# Patient Record
Sex: Male | Born: 2000
Health system: Southern US, Community
[De-identification: ages and names within clinical notes are randomized; demographics above are authoritative.]

## PROBLEM LIST (undated history)

## (undated) DIAGNOSIS — B019 Varicella without complication: Secondary | ICD-10-CM

## (undated) DIAGNOSIS — T7840XA Allergy, unspecified, initial encounter: Secondary | ICD-10-CM

## (undated) HISTORY — DX: Allergy, unspecified, initial encounter: T78.40XA

## (undated) HISTORY — DX: Varicella without complication: B01.9

---
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
## 2019-01-02 ENCOUNTER — Other Ambulatory Visit: Payer: Self-pay

## 2019-01-02 ENCOUNTER — Other Ambulatory Visit: Payer: 59 | Admitting: *Deleted

## 2019-01-02 ENCOUNTER — Ambulatory Visit (HOSPITAL_COMMUNITY): Payer: 59 | Attending: Cardiovascular Disease

## 2019-01-02 DIAGNOSIS — U071 COVID-19: Secondary | ICD-10-CM | POA: Insufficient documentation

## 2019-01-02 DIAGNOSIS — Z03818 Encounter for observation for suspected exposure to other biological agents ruled out: Secondary | ICD-10-CM | POA: Diagnosis not present

## 2019-01-02 LAB — TROPONIN I (HIGH SENSITIVITY): Troponin I (High Sensitivity): 4 ng/L (ref <18)

## 2019-01-03 ENCOUNTER — Other Ambulatory Visit (HOSPITAL_COMMUNITY): Payer: 59

## 2019-10-15 ENCOUNTER — Ambulatory Visit: Payer: 59 | Attending: Internal Medicine

## 2019-10-15 ENCOUNTER — Ambulatory Visit: Payer: 59

## 2019-10-15 DIAGNOSIS — Z23 Encounter for immunization: Secondary | ICD-10-CM

## 2019-10-15 DIAGNOSIS — H5213 Myopia, bilateral: Secondary | ICD-10-CM | POA: Diagnosis not present

## 2019-10-15 NOTE — Progress Notes (Signed)
   Covid-19 Vaccination Clinic  Name:  Jeffrey Lozano    MRN: [id number] DOB: [birth year]  10/15/2019  Mr. Jeffrey Lozano was observed post Covid-19 immunization for 15 minutes without incident. He was provided with Vaccine Information Sheet and instruction to access the V-Safe system.   Mr. Jeffrey Lozano was instructed to call 911 with any severe reactions post vaccine: Marland Kitchen Difficulty breathing  . Swelling of face and throat  . A fast heartbeat  . A bad rash all over body  . Dizziness and weakness   Immunizations Administered    Name Date Dose VIS Date Route   Pfizer COVID-19 Vaccine 10/15/2019  2:28 PM 0.3 mL 05/01/2018 Intramuscular   Manufacturer: [employer], [employer]   Lot: [id number]   NDC: [address]-9

## 2019-11-05 ENCOUNTER — Ambulatory Visit: Payer: 59 | Attending: Internal Medicine

## 2019-11-05 DIAGNOSIS — Z23 Encounter for immunization: Secondary | ICD-10-CM

## 2019-11-05 NOTE — Progress Notes (Signed)
   Covid-19 Vaccination Clinic  Name:  Jeffrey Lozano    MRN: [id number] DOB: [birth year]  11/05/2019  Jeffrey Lozano was observed post Covid-19 immunization for 15 minutes without incident. He was provided with Vaccine Information Sheet and instruction to access the V-Safe system.   Jeffrey Lozano was instructed to call 911 with any severe reactions post vaccine: Marland Kitchen Difficulty breathing  . Swelling of face and throat  . A fast heartbeat  . A bad rash all over body  . Dizziness and weakness   Immunizations Administered    Name Date Dose VIS Date Route   Pfizer COVID-19 Vaccine 11/05/2019 11:17 AM 0.3 mL 05/01/2018 Intramuscular   Manufacturer: [employer], [employer]   Lot: [id number]   NDC: [address]-3

## 2019-11-06 DIAGNOSIS — Z1159 Encounter for screening for other viral diseases: Secondary | ICD-10-CM | POA: Diagnosis not present

## 2019-11-06 DIAGNOSIS — Z20828 Contact with and (suspected) exposure to other viral communicable diseases: Secondary | ICD-10-CM | POA: Diagnosis not present

## 2019-11-27 DIAGNOSIS — R0609 Other forms of dyspnea: Secondary | ICD-10-CM | POA: Diagnosis not present

## 2019-12-17 DIAGNOSIS — J309 Allergic rhinitis, unspecified: Secondary | ICD-10-CM | POA: Diagnosis not present

## 2019-12-17 DIAGNOSIS — Z23 Encounter for immunization: Secondary | ICD-10-CM | POA: Diagnosis not present

## 2019-12-17 DIAGNOSIS — Z0000 Encounter for general adult medical examination without abnormal findings: Secondary | ICD-10-CM | POA: Diagnosis not present

## 2019-12-17 DIAGNOSIS — R319 Hematuria, unspecified: Secondary | ICD-10-CM | POA: Diagnosis not present

## 2019-12-17 DIAGNOSIS — Z87442 Personal history of urinary calculi: Secondary | ICD-10-CM | POA: Diagnosis not present

## 2019-12-17 DIAGNOSIS — J45990 Exercise induced bronchospasm: Secondary | ICD-10-CM | POA: Diagnosis not present

## 2019-12-19 ENCOUNTER — Other Ambulatory Visit (HOSPITAL_COMMUNITY): Payer: Self-pay | Admitting: *Deleted

## 2019-12-19 DIAGNOSIS — R0600 Dyspnea, unspecified: Secondary | ICD-10-CM

## 2019-12-24 ENCOUNTER — Encounter (HOSPITAL_COMMUNITY): Payer: 59

## 2020-01-07 ENCOUNTER — Other Ambulatory Visit (HOSPITAL_COMMUNITY): Payer: Self-pay | Admitting: *Deleted

## 2020-01-07 ENCOUNTER — Ambulatory Visit (HOSPITAL_COMMUNITY): Payer: 59 | Attending: Family Medicine

## 2020-01-07 ENCOUNTER — Other Ambulatory Visit: Payer: Self-pay

## 2020-01-07 DIAGNOSIS — R0600 Dyspnea, unspecified: Secondary | ICD-10-CM | POA: Insufficient documentation

## 2020-04-09 ENCOUNTER — Other Ambulatory Visit: Payer: Self-pay

## 2020-04-09 ENCOUNTER — Ambulatory Visit: Payer: 59 | Attending: Internal Medicine

## 2020-04-09 DIAGNOSIS — Z23 Encounter for immunization: Secondary | ICD-10-CM

## 2020-04-09 NOTE — Progress Notes (Signed)
   Covid-19 Vaccination Clinic  Name:  Jeffrey Lozano    MRN: [id number] DOB: 2000-08-17  04/09/2020  Jeffrey Lozano was observed post Covid-19 immunization for 15 minutes without incident. He was provided with Vaccine Information Sheet and instruction to access the V-Safe system.   Jeffrey Lozano was instructed to call 911 with any severe reactions post vaccine: Marland Kitchen Difficulty breathing  . Swelling of face and throat  . A fast heartbeat  . A bad rash all over body  . Dizziness and weakness   Immunizations Administered    Name Date Dose VIS Date Route   PFIZER Comrnaty(Gray TOP) Covid-19 Vaccine 04/09/2020  1:13 PM 0.3 mL 02/13/2020 Intramuscular   Manufacturer: [employer], [employer]   Lot: [id number]   NDC: [phone]

## 2020-04-10 ENCOUNTER — Ambulatory Visit: Payer: 59

## 2020-08-03 IMAGING — CT CT ABDOMEN AND PELVIS WITH CONTRAST
2 of 4 series · 15 of 46 positions shown, 17 images · IV contrast (APPLIED)
Comparison: None.

CLINICAL DATA: Right-sided abdominal pain and emesis beginning
yesterday. Abdominal pain, acute, generalized.

EXAM:
CT ABDOMEN AND PELVIS WITH CONTRAST
TECHNIQUE: Multidetector CT imaging of the abdomen and pelvis was performed
using the standard protocol following bolus administration of
intravenous contrast.
CONTRAST:  100mL OMNIPAQUE IOHEXOL 300 MG/ML  SOLN

[Series 3: abdomen 5.0 · axial · 0.75mm/px · z∈[+797,+1222]mm · 12 of 99 slices shown, 14 images]
[im 7/99  soft-tissue]
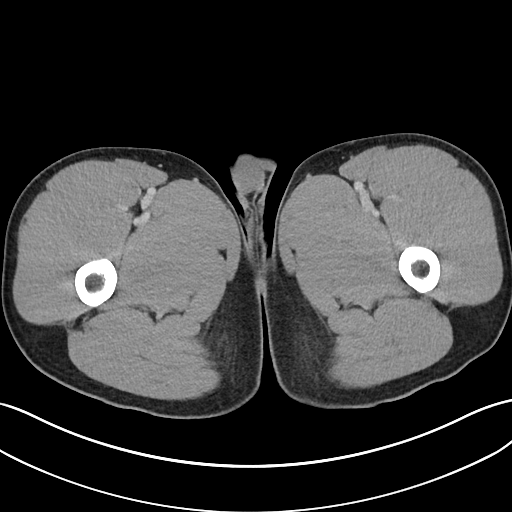
[im 7/99  bone]
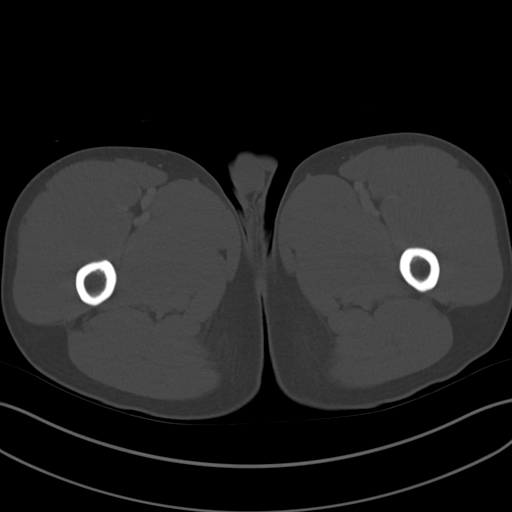
[im 13/99  soft-tissue]
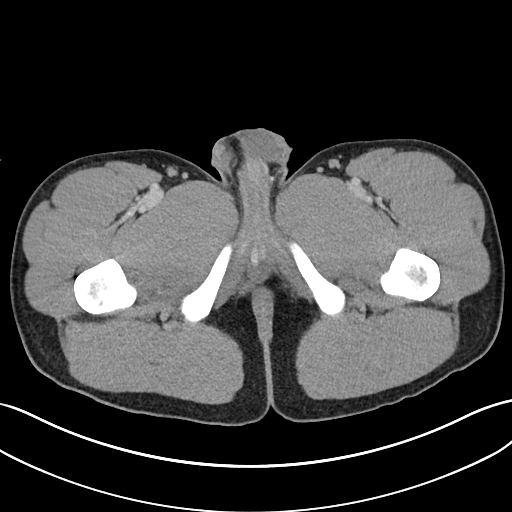
[im 25/99  soft-tissue]
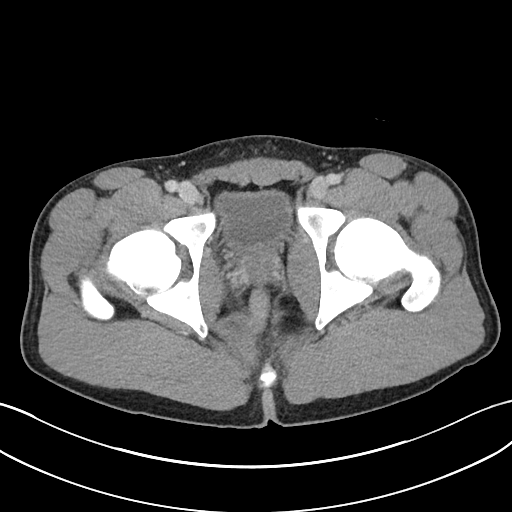
[im 31/99  soft-tissue]
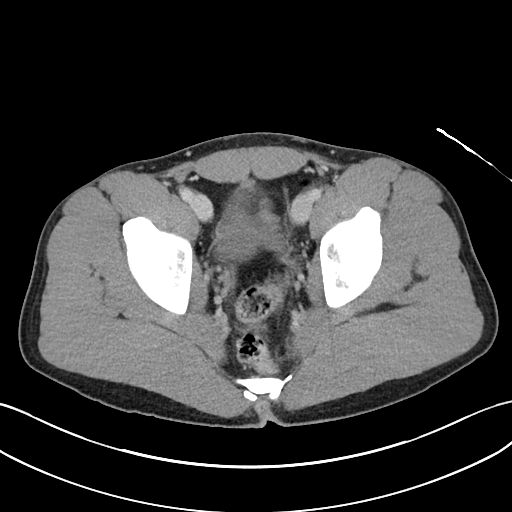
[im 37/99  soft-tissue]
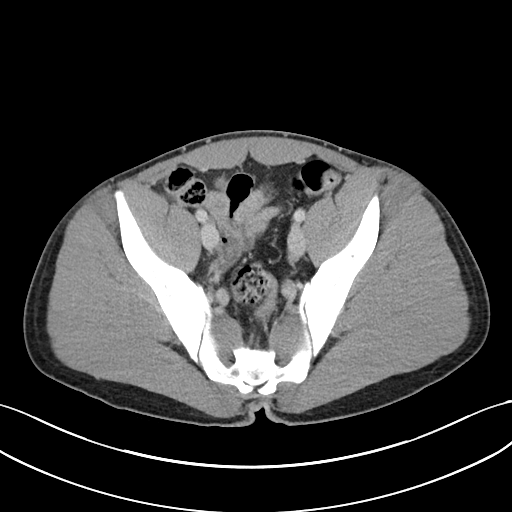
[im 43/99  soft-tissue]
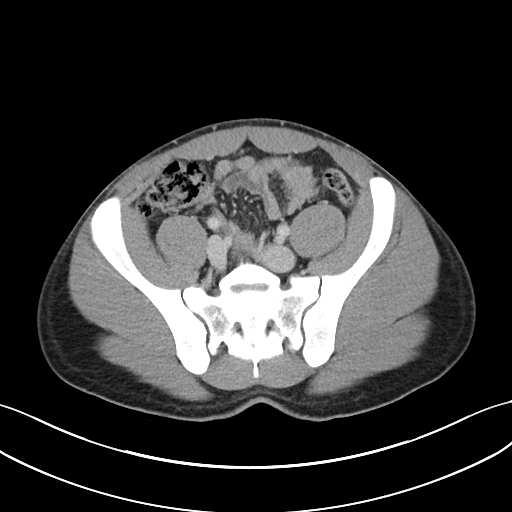
[im 56/99  soft-tissue]
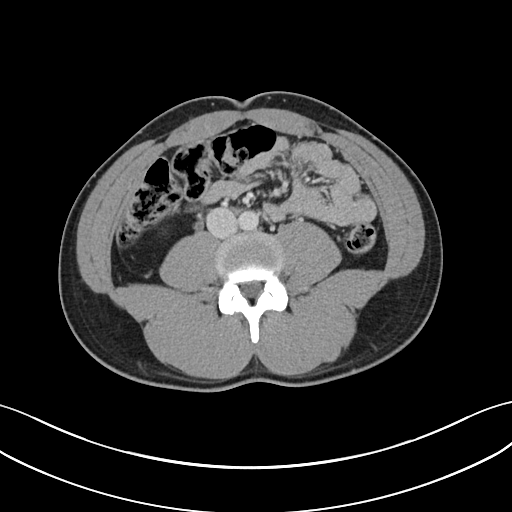
[im 62/99  soft-tissue]
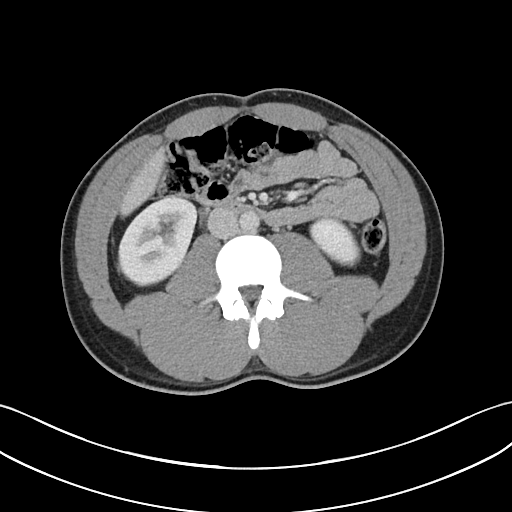
[im 68/99  soft-tissue]
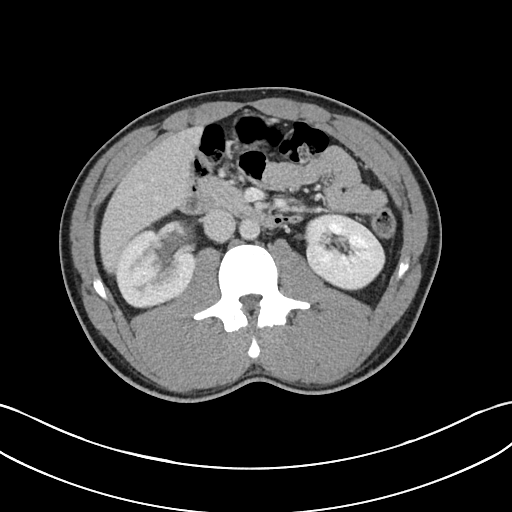
[im 68/99  bone]
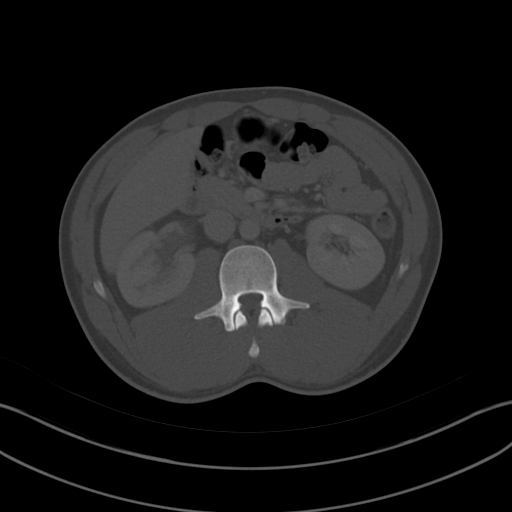
[im 74/99  soft-tissue]
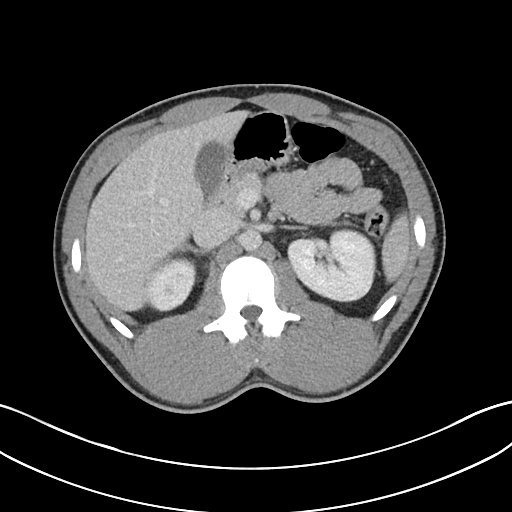
[im 86/99  soft-tissue]
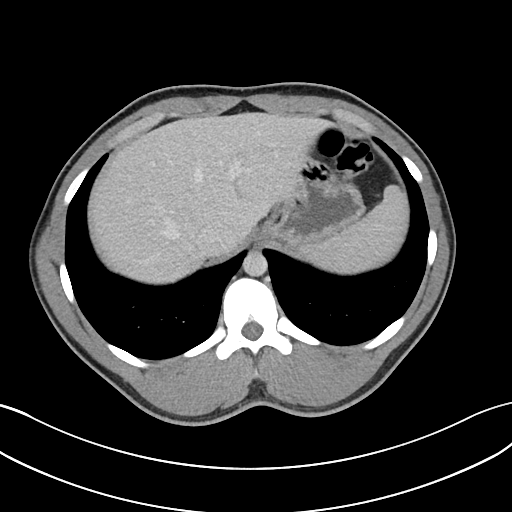
[im 92/99  soft-tissue]
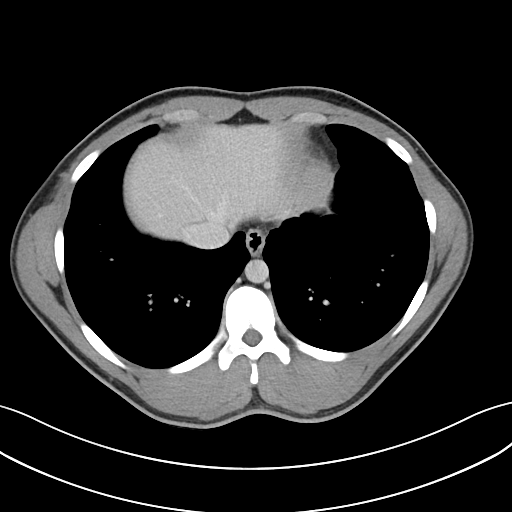

[Series 6: abdomen 3.0 mpr cor · coronal · 0.76mm/px · 3 of 97 slices shown]
[im 33/97  soft-tissue]
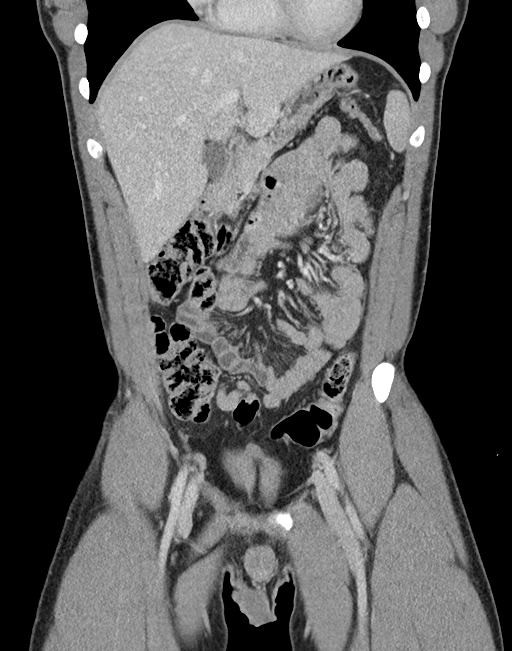
[im 43/97  soft-tissue]
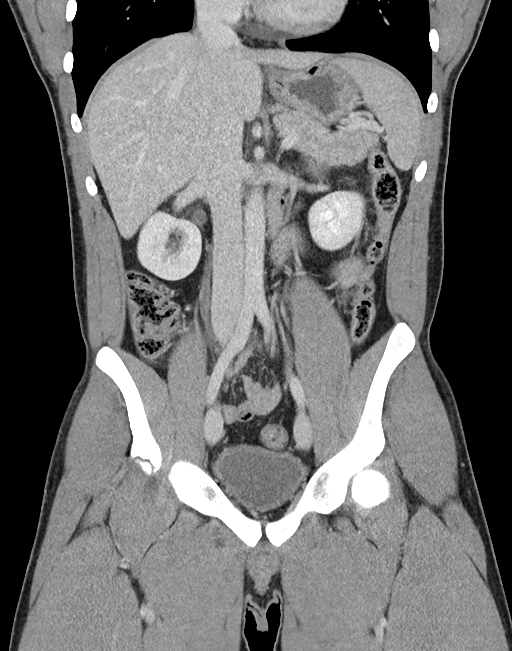
[im 54/97  soft-tissue]
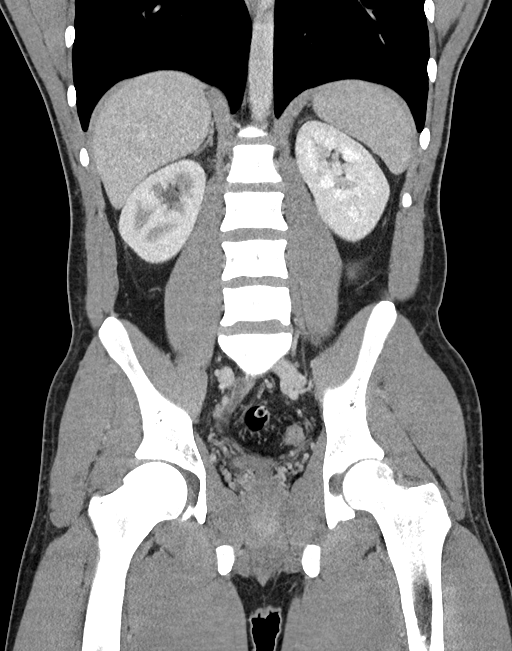

[15 of 46 positions shown; findings below may reference images not displayed]

FINDINGS: Lower chest: Lung bases are clear without focal nodule, mass, or
airspace disease. The heart size is normal. There is no pleural or
pericardial effusion.

Hepatobiliary: No focal liver abnormality is seen. No gallstones,
gallbladder wall thickening, or biliary dilatation.

Pancreas: Unremarkable. No pancreatic ductal dilatation or
surrounding inflammatory changes.

Spleen: Normal in size without focal abnormality.

Adrenals/Urinary Tract: The adrenal glands are normal bilaterally.
Mild right-sided hydronephrosis is present. The right ureter is
dilated to the level of the pelvic inlet. No obstructing lesion is
evident. There are no definite stones in either kidney. No distal
ureteral stone is evident. The urinary bladder is within normal
limits.

Stomach/Bowel: The stomach and duodenum are within normal limits.
Small bowel is normal. Terminal ileum is within normal limits. The
appendix is visualized and normal. The ascending and transverse
colon is within normal limits. The descending and sigmoid colon are
normal.

Vascular/Lymphatic: No significant vascular findings are present. No
enlarged abdominal or pelvic lymph nodes.

Reproductive: Prostate is unremarkable.

Other: No abdominal wall hernia or abnormality. No abdominopelvic
ascites.

Musculoskeletal: Vertebral body heights and alignment are
maintained. No focal lytic or blastic lesions are present.
Transitional anatomy is present at S1
IMPRESSION: 1. Mild right-sided hydronephrosis and dilation of the right ureter
to the level of the pelvic inlet. No obstructing lesion is present.
This may reflect recent passage of a stone. Urinalysis does not
support infection. No mass lesion is evident.
2. No significant nephrolithiasis is evident.
3. No acute or focal abnormality otherwise to explain right-sided
abdominal pain.

## 2020-12-21 DIAGNOSIS — Z0000 Encounter for general adult medical examination without abnormal findings: Secondary | ICD-10-CM | POA: Diagnosis not present

## 2020-12-21 DIAGNOSIS — Z8379 Family history of other diseases of the digestive system: Secondary | ICD-10-CM | POA: Diagnosis not present

## 2020-12-21 DIAGNOSIS — R140 Abdominal distension (gaseous): Secondary | ICD-10-CM | POA: Diagnosis not present

## 2020-12-21 DIAGNOSIS — Z23 Encounter for immunization: Secondary | ICD-10-CM | POA: Diagnosis not present

## 2020-12-21 DIAGNOSIS — Z87442 Personal history of urinary calculi: Secondary | ICD-10-CM | POA: Diagnosis not present

## 2020-12-21 DIAGNOSIS — J309 Allergic rhinitis, unspecified: Secondary | ICD-10-CM | POA: Diagnosis not present

## 2020-12-21 DIAGNOSIS — J45990 Exercise induced bronchospasm: Secondary | ICD-10-CM | POA: Diagnosis not present

## 2020-12-21 DIAGNOSIS — Z7689 Persons encountering health services in other specified circumstances: Secondary | ICD-10-CM | POA: Diagnosis not present

## 2021-03-05 ENCOUNTER — Other Ambulatory Visit (HOSPITAL_COMMUNITY): Payer: Self-pay

## 2021-03-05 MED ORDER — CARESTART COVID-19 HOME TEST VI KIT
PACK | 0 refills | Status: AC
  Filled 2021-03-05: qty 4, 4d supply, fill #0

## 2021-06-22 ENCOUNTER — Other Ambulatory Visit: Payer: Self-pay

## 2021-06-22 ENCOUNTER — Ambulatory Visit (HOSPITAL_COMMUNITY)
Admission: EM | Admit: 2021-06-22 | Discharge: 2021-06-22 | Disposition: A | Discharge status: Home / Self Care | Payer: 59 | Attending: Family Medicine | Admitting: Family Medicine

## 2021-06-22 ENCOUNTER — Encounter (HOSPITAL_COMMUNITY): Payer: Self-pay | Admitting: Emergency Medicine

## 2021-06-22 DIAGNOSIS — Z8616 Personal history of COVID-19: Secondary | ICD-10-CM | POA: Diagnosis not present

## 2021-06-22 DIAGNOSIS — R509 Fever, unspecified: Secondary | ICD-10-CM | POA: Insufficient documentation

## 2021-06-22 DIAGNOSIS — J029 Acute pharyngitis, unspecified: Secondary | ICD-10-CM | POA: Insufficient documentation

## 2021-06-22 DIAGNOSIS — Z20822 Contact with and (suspected) exposure to covid-19: Secondary | ICD-10-CM | POA: Insufficient documentation

## 2021-06-22 LAB — POCT RAPID STREP A, ED / UC: Streptococcus, Group A Screen (Direct): NEGATIVE

## 2021-06-22 LAB — POCT INFECTIOUS MONO SCREEN, ED / UC: Mono Screen: NEGATIVE

## 2021-06-22 MED ORDER — AMOXICILLIN 500 MG PO CAPS: 500.0000 mg | ORAL_CAPSULE | Freq: Two times a day (BID) | ORAL | 0 refills | Status: DC

## 2021-06-22 NOTE — ED Triage Notes (Signed)
Woke this morning feeling bad.  Has had chills.  Reports temp 100.7 earlier today.  Patient has a sore throat.   ?

## 2021-06-22 NOTE — ED Provider Notes (Signed)
?[facility] ? ? ? ?CSN: [id number] ?Arrival date & time: 06/22/21  1924 ? ? ?  ? ?History   ?Chief Complaint ?Chief Complaint  ?Patient presents with  ? Sore Throat  ? ? ?HPI ?Jeffrey Lozano is a 21 y.o. male.  ? ?Patient presents today with a 1 day history of chills and fever.  Reports temperature has been as high as 100.7 ?F.  He has been taking ibuprofen without improvement of symptoms.  Reports associated sore throat with pain rated 6 on a 0-10 pain scale, described as aching, worse with swallowing, no alleviating factors identified.  He has had a decreased appetite but denies any dysphagia, muffled voice, swelling of his throat.  He has had mild nasal congestion as well as body aches but denies any cough, shortness of breath, chest pain, nausea, vomiting.  Reports current symptoms are similar to previous episodes of strep.  Denies any recent antibiotic use.  Denies any known sick contacts.  He has had COVID with last episode in September 2022. ? ? ?Past Medical History:  ?Diagnosis Date  ? Allergy   ? Varicella   ? age 87  ? ? ?There are no problems to display for this patient. ? ? ?Past Surgical History:  ?Procedure Laterality Date  ? KNEE SURGERY    ? ? ? ? ? ?Home Medications   ? ?Prior to Admission medications   ?Medication Sig Start Date End Date Taking? Authorizing Provider  ?amoxicillin (AMOXIL) 500 MG capsule Take 1 capsule (500 mg total) by mouth in the morning and at bedtime. 06/22/21  Yes Dannette Kinkaid K, PA-C  ?cetirizine (ZYRTEC) 10 MG tablet Take 5 mg by mouth as needed. ?Patient not taking: Reported on 06/22/2021    [provider]  ?COVID-19 At Home Antigen Test [facility] COVID-19 HOME TEST) KIT Use as directed 03/05/21   Jefm Bryant, [facility]  ?fluticasone (FLONASE) 50 MCG/ACT nasal spray Place 2 sprays into both nostrils daily. ?Patient not taking: Reported on 06/22/2021 09/20/17   Sharion Balloon, FNP  ?ibuprofen (ADVIL) 800 MG tablet Take 1 tablet (800 mg total) by mouth 3  (three) times daily. 08/24/18   Mesner, Corene Cornea, MD  ?Multiple Vitamins-Minerals (MULTIVITAMIN & MINERAL PO) Take by mouth. ?Patient not taking: Reported on 06/22/2021    [provider]  ?ondansetron (ZOFRAN ODT) 4 MG disintegrating tablet 43m ODT q4 hours prn nausea/vomit ?Patient not taking: Reported on 06/22/2021 08/24/18   Mesner, JCorene Cornea MD  ?oxyCODONE-acetaminophen (PERCOCET) 5-325 MG tablet Take 2 tablets by mouth every 4 (four) hours as needed. ?Patient not taking: Reported on 06/22/2021 08/24/18   Mesner, JCorene Cornea MD  ?tamsulosin (FLOMAX) 0.4 MG CAPS capsule Take 1 capsule (0.4 mg total) by mouth daily. ?Patient not taking: Reported on 06/22/2021 08/24/18   Mesner, JCorene Cornea MD  ? ? ?Family History ?Family History  ?Problem Relation Age of Onset  ? Allergies Neg Hx   ? Lung cancer Neg Hx   ? Stroke Neg Hx   ? Diabetes Neg Hx   ? Asthma Neg Hx   ? Hyperlipidemia Neg Hx   ? Hypertension Neg Hx   ? ? ?Social History ?Social History  ? ?Tobacco Use  ? Smoking status: Never  ? Smokeless tobacco: Never  ?Vaping Use  ? Vaping Use: Never used  ?Substance Use Topics  ? Alcohol use: Never  ? Drug use: Never  ? ? ? ?Allergies   ?Patient has no known allergies. ? ? ?Review of Systems ?Review of  Systems  ?Constitutional:  Positive for activity change, appetite change, fatigue and fever.  ?HENT:  Positive for congestion and sore throat. Negative for sinus pressure, sneezing, trouble swallowing and voice change.   ?Respiratory:  Negative for cough and shortness of breath.   ?Cardiovascular:  Negative for chest pain.  ?Gastrointestinal:  Negative for abdominal pain, diarrhea, nausea and vomiting.  ?Musculoskeletal:  Positive for arthralgias and myalgias.  ?Neurological:  Positive for headaches. Negative for dizziness and light-headedness.  ? ? ?Physical Exam ?Triage Vital Signs ?ED Triage Vitals  ?Enc Vitals Group  ?   BP 06/22/21 1954 125/74  ?   Pulse Rate 06/22/21 1954 96  ?   Resp 06/22/21 1954 20  ?   Temp 06/22/21 1954  100.1 ?F (37.8 ?C)  ?   Temp Source 06/22/21 1954 Oral  ?   SpO2 06/22/21 1954 95 %  ?   Weight --   ?   Height --   ?   Head Circumference --   ?   Peak Flow --   ?   Pain Score 06/22/21 1950 6  ?   Pain Loc --   ?   Pain Edu? --   ?   Excl. in [employer]? --   ? ?No data found. ? ?Updated Vital Signs ?BP 125/74 (BP Location: Right Arm)   Pulse 96   Temp 100.1 ?F (37.8 ?C) (Oral)   Resp 20   SpO2 95%  ? ?Visual Acuity ?Right Eye Distance:   ?Left Eye Distance:   ?Bilateral Distance:   ? ?Right Eye Near:   ?Left Eye Near:    ?Bilateral Near:    ? ?Physical Exam ?Vitals reviewed.  ?Constitutional:   ?   General: He is awake.  ?   Appearance: Normal appearance. He is well-developed. He is not ill-appearing.  ?   Comments: Very pleasant male appears stated age in no acute distress sitting comfortably in exam room  ?HENT:  ?   Head: Normocephalic and atraumatic.  ?   Right Ear: Tympanic membrane, ear canal and external ear normal. Tympanic membrane is not erythematous or bulging.  ?   Left Ear: Tympanic membrane, ear canal and external ear normal. Tympanic membrane is not erythematous or bulging.  ?   Nose: Nose normal.  ?   Mouth/Throat:  ?   Pharynx: Uvula midline. Posterior oropharyngeal erythema present. No oropharyngeal exudate or uvula swelling.  ?   Tonsils: No tonsillar exudate or tonsillar abscesses.  ?Cardiovascular:  ?   Rate and Rhythm: Normal rate and regular rhythm.  ?   Heart sounds: Normal heart sounds, S1 normal and S2 normal. No murmur heard. ?Pulmonary:  ?   Effort: Pulmonary effort is normal. No accessory muscle usage or respiratory distress.  ?   Breath sounds: Normal breath sounds. No stridor. No wheezing, rhonchi or rales.  ?   Comments: Clear to auscultation bilaterally ?Abdominal:  ?   General: Bowel sounds are normal.  ?   Palpations: Abdomen is soft.  ?   Tenderness: There is no abdominal tenderness.  ?Lymphadenopathy:  ?   Head:  ?   Right side of head: No submental, submandibular or tonsillar  adenopathy.  ?   Left side of head: No submental, submandibular or tonsillar adenopathy.  ?   Cervical: No cervical adenopathy.  ?Neurological:  ?   Mental Status: He is alert.  ?Psychiatric:     ?   Behavior: Behavior is cooperative.  ? ? ? ?  UC Treatments / Results  ?Labs ?(all labs ordered are listed, but only abnormal results are displayed) ?Labs Reviewed  ?CULTURE, GROUP A STREP ([facility])  ?SARS CORONAVIRUS 2 (TAT 6-24 HRS)  ?POCT RAPID STREP A, ED / UC  ?POCT INFECTIOUS MONO SCREEN, ED / UC  ? ? ?EKG ? ? ?Radiology ?No results found. ? ?Procedures ?Procedures (including critical care time) ? ?Medications Ordered in UC ?Medications - No data to display ? ?Initial Impression / Assessment and Plan / UC Course  ?I have reviewed the triage vital signs and the nursing notes. ? ?Pertinent labs & imaging results that were available during my care of the patient were reviewed by me and considered in my medical decision making (see chart for details). ? ?  ? ?Centor score of 2; rapid strep negative in clinic.  Throat culture pending.  Mono negative.  Discussed that mono being negative could be false due to early illness.  COVID testing was obtained-results pending.  The patient believes symptoms are similar to previous episodes of strep so discussed that if COVID testing is negative and he continues to have high fever and worsening sore throat he can start amoxicillin in a few days.  Prescription was sent to the pharmacy to have on hand.  Discussed that if he is truly positive for mono and takes amoxicillin he will develop a rash; if he develops any rash he is to come here for reevaluation and stop the medication.  Recommended over-the-counter medications including gargling with warm salt water and Tylenol and ibuprofen.  He is to rest and drink plenty of fluid.  Discussed that if he has any worsening symptoms including high fever, muffled voice, odynophagia, dysphagia, nausea/vomiting, chest pain, shortness of breath he  needs to go to the emergency room.  Strict return precautions given.  He was provided work excuse note with current CDC return to work guidelines based on COVID test result. ? ?Final Clinical Impressions(s) / UC Diag

## 2021-06-22 NOTE — Discharge Instructions (Addendum)
Your strep test was negative in clinic today.  Your monotest was negative.  We sent out a COVID test and we will contact you if this is positive.  Please monitor your MyChart for these results.  I am not exactly sure what is causing your symptoms.  If you continue to have a fever and severe sore throat after your COVID test comes back negative you can start amoxicillin as we discussed.  Gargle with warm salt water and alternate Tylenol ibuprofen for pain.  If you have any worsening symptoms including high fever not responding to medication, chest pain, shortness of breath, nausea/vomiting, difficulty speaking, difficulty swallowing, swelling of your throat you can need to go to the emergency room.  If at any point you develop a rash please return for reevaluation. ?

## 2021-06-23 LAB — SARS CORONAVIRUS 2 (TAT 6-24 HRS): SARS Coronavirus 2: NEGATIVE

## 2021-06-25 LAB — CULTURE, GROUP A STREP (THRC)

## 2021-12-15 DIAGNOSIS — M25562 Pain in left knee: Secondary | ICD-10-CM | POA: Diagnosis not present

## 2021-12-22 DIAGNOSIS — Z0000 Encounter for general adult medical examination without abnormal findings: Secondary | ICD-10-CM | POA: Diagnosis not present

## 2021-12-22 DIAGNOSIS — J309 Allergic rhinitis, unspecified: Secondary | ICD-10-CM | POA: Diagnosis not present

## 2021-12-22 DIAGNOSIS — Z131 Encounter for screening for diabetes mellitus: Secondary | ICD-10-CM | POA: Diagnosis not present

## 2021-12-22 DIAGNOSIS — Z23 Encounter for immunization: Secondary | ICD-10-CM | POA: Diagnosis not present

## 2021-12-22 DIAGNOSIS — Z87442 Personal history of urinary calculi: Secondary | ICD-10-CM | POA: Diagnosis not present

## 2021-12-22 DIAGNOSIS — J45990 Exercise induced bronchospasm: Secondary | ICD-10-CM | POA: Diagnosis not present

## 2022-04-14 ENCOUNTER — Telehealth: Payer: 59 | Admitting: Physician Assistant

## 2022-04-14 ENCOUNTER — Other Ambulatory Visit (HOSPITAL_COMMUNITY): Payer: Self-pay

## 2022-04-14 DIAGNOSIS — B9689 Other specified bacterial agents as the cause of diseases classified elsewhere: Secondary | ICD-10-CM | POA: Diagnosis not present

## 2022-04-14 DIAGNOSIS — J019 Acute sinusitis, unspecified: Secondary | ICD-10-CM

## 2022-04-14 MED ORDER — AMOXICILLIN-POT CLAVULANATE 875-125 MG PO TABS
1.0000 | ORAL_TABLET | Freq: Two times a day (BID) | ORAL | 0 refills | Status: DC
  Filled 2022-04-14: qty 14, 7d supply, fill #0

## 2022-04-14 NOTE — Progress Notes (Signed)
I have spent 5 minutes in review of e-visit questionnaire, review and updating patient chart, medical decision making and response to patient.   Nadalyn Deringer Cody Kasara Schomer, PA-C    

## 2022-04-14 NOTE — Progress Notes (Signed)

## 2022-10-30 ENCOUNTER — Telehealth: Payer: 59 | Admitting: Family

## 2022-10-30 DIAGNOSIS — J069 Acute upper respiratory infection, unspecified: Secondary | ICD-10-CM | POA: Diagnosis not present

## 2022-10-30 MED ORDER — CETIRIZINE HCL 10 MG PO TABS: 10.0000 mg | ORAL_TABLET | Freq: Every day | ORAL | 1 refills | Status: AC

## 2022-10-30 MED ORDER — FLUTICASONE PROPIONATE 50 MCG/ACT NA SUSP: 2.0000 | Freq: Every day | NASAL | 6 refills | Status: AC

## 2022-10-30 NOTE — Progress Notes (Signed)

## 2022-12-06 DIAGNOSIS — H5213 Myopia, bilateral: Secondary | ICD-10-CM | POA: Diagnosis not present

## 2022-12-29 DIAGNOSIS — Z23 Encounter for immunization: Secondary | ICD-10-CM | POA: Diagnosis not present

## 2022-12-29 DIAGNOSIS — J45990 Exercise induced bronchospasm: Secondary | ICD-10-CM | POA: Diagnosis not present

## 2022-12-29 DIAGNOSIS — Z87442 Personal history of urinary calculi: Secondary | ICD-10-CM | POA: Diagnosis not present

## 2022-12-29 DIAGNOSIS — J309 Allergic rhinitis, unspecified: Secondary | ICD-10-CM | POA: Diagnosis not present

## 2022-12-29 DIAGNOSIS — Z0000 Encounter for general adult medical examination without abnormal findings: Secondary | ICD-10-CM | POA: Diagnosis not present

## 2023-04-12 ENCOUNTER — Telehealth: Payer: 59 | Admitting: Physician Assistant

## 2023-04-12 DIAGNOSIS — J019 Acute sinusitis, unspecified: Secondary | ICD-10-CM | POA: Diagnosis not present

## 2023-04-12 DIAGNOSIS — B9689 Other specified bacterial agents as the cause of diseases classified elsewhere: Secondary | ICD-10-CM | POA: Diagnosis not present

## 2023-04-12 MED ORDER — AMOXICILLIN-POT CLAVULANATE 875-125 MG PO TABS: 1.0000 | ORAL_TABLET | Freq: Two times a day (BID) | ORAL | 0 refills | Status: AC

## 2023-04-12 NOTE — Progress Notes (Signed)

## 2023-04-12 NOTE — Progress Notes (Signed)
 I have spent 5 minutes in review of e-visit questionnaire, review and updating patient chart, medical decision making and response to patient.   Piedad Climes, PA-C

## 2023-09-15 DIAGNOSIS — I10 Essential (primary) hypertension: Secondary | ICD-10-CM | POA: Diagnosis not present

## 2023-10-06 DIAGNOSIS — I10 Essential (primary) hypertension: Secondary | ICD-10-CM | POA: Diagnosis not present

## 2024-01-04 DIAGNOSIS — Z0000 Encounter for general adult medical examination without abnormal findings: Secondary | ICD-10-CM | POA: Diagnosis not present

## 2024-01-04 DIAGNOSIS — J45990 Exercise induced bronchospasm: Secondary | ICD-10-CM | POA: Diagnosis not present

## 2024-01-04 DIAGNOSIS — J309 Allergic rhinitis, unspecified: Secondary | ICD-10-CM | POA: Diagnosis not present

## 2024-01-04 DIAGNOSIS — Z87442 Personal history of urinary calculi: Secondary | ICD-10-CM | POA: Diagnosis not present

## 2024-01-04 DIAGNOSIS — Z23 Encounter for immunization: Secondary | ICD-10-CM | POA: Diagnosis not present

## 2024-01-23 DIAGNOSIS — H5213 Myopia, bilateral: Secondary | ICD-10-CM | POA: Diagnosis not present
